# Patient Record
Sex: Female | Born: 1980 | Race: Black or African American | Hispanic: No | Marital: Single | Smoking: Never smoker
Health system: Southern US, Community
[De-identification: ages and names within clinical notes are randomized; demographics above are authoritative.]

---

## 2000-08-22 ENCOUNTER — Emergency Department (HOSPITAL_COMMUNITY): Admission: EM | Admit: 2000-08-22 | Discharge: 2000-08-22 | Payer: Self-pay | Admitting: Emergency Medicine

## 2000-08-27 ENCOUNTER — Emergency Department (HOSPITAL_COMMUNITY): Admission: EM | Admit: 2000-08-27 | Discharge: 2000-08-27 | Payer: Self-pay | Admitting: Emergency Medicine

## 2000-09-09 ENCOUNTER — Other Ambulatory Visit: Admission: RE | Admit: 2000-09-09 | Discharge: 2000-09-09 | Payer: Self-pay | Admitting: Obstetrics

## 2001-02-16 ENCOUNTER — Inpatient Hospital Stay (HOSPITAL_COMMUNITY): Admission: AD | Admit: 2001-02-16 | Discharge: 2001-02-16 | Payer: Self-pay | Admitting: Obstetrics

## 2001-03-04 ENCOUNTER — Inpatient Hospital Stay (HOSPITAL_COMMUNITY): Admission: AD | Admit: 2001-03-04 | Discharge: 2001-03-07 | Payer: Self-pay | Admitting: Obstetrics

## 2001-03-09 ENCOUNTER — Inpatient Hospital Stay (HOSPITAL_COMMUNITY): Admission: AD | Admit: 2001-03-09 | Discharge: 2001-03-09 | Payer: Self-pay | Admitting: Obstetrics

## 2002-09-03 ENCOUNTER — Emergency Department (HOSPITAL_COMMUNITY): Admission: EM | Admit: 2002-09-03 | Discharge: 2002-09-03 | Payer: Self-pay | Admitting: Emergency Medicine

## 2003-02-22 ENCOUNTER — Emergency Department (HOSPITAL_COMMUNITY): Admission: EM | Admit: 2003-02-22 | Discharge: 2003-02-22 | Payer: Self-pay | Admitting: Emergency Medicine

## 2004-11-02 ENCOUNTER — Inpatient Hospital Stay (HOSPITAL_COMMUNITY): Admission: AD | Admit: 2004-11-02 | Discharge: 2004-11-02 | Payer: Self-pay | Admitting: Obstetrics and Gynecology

## 2004-11-18 ENCOUNTER — Inpatient Hospital Stay (HOSPITAL_COMMUNITY): Admission: AD | Admit: 2004-11-18 | Discharge: 2004-11-18 | Payer: Self-pay | Admitting: *Deleted

## 2005-05-13 ENCOUNTER — Inpatient Hospital Stay (HOSPITAL_COMMUNITY): Admission: AD | Admit: 2005-05-13 | Discharge: 2005-05-13 | Payer: Self-pay | Admitting: Obstetrics

## 2005-06-06 ENCOUNTER — Inpatient Hospital Stay (HOSPITAL_COMMUNITY): Admission: AD | Admit: 2005-06-06 | Discharge: 2005-06-06 | Payer: Self-pay | Admitting: Obstetrics

## 2005-06-16 ENCOUNTER — Inpatient Hospital Stay (HOSPITAL_COMMUNITY): Admission: AD | Admit: 2005-06-16 | Discharge: 2005-06-19 | Payer: Self-pay | Admitting: Obstetrics

## 2005-10-15 ENCOUNTER — Emergency Department (HOSPITAL_COMMUNITY): Admission: EM | Admit: 2005-10-15 | Discharge: 2005-10-15 | Payer: Self-pay | Admitting: Emergency Medicine

## 2006-07-21 ENCOUNTER — Ambulatory Visit (HOSPITAL_COMMUNITY): Admission: RE | Admit: 2006-07-21 | Discharge: 2006-07-21 | Payer: Self-pay | Admitting: Obstetrics and Gynecology

## 2006-08-24 ENCOUNTER — Inpatient Hospital Stay (HOSPITAL_COMMUNITY): Admission: AD | Admit: 2006-08-24 | Discharge: 2006-08-24 | Payer: Self-pay | Admitting: Obstetrics

## 2006-09-13 ENCOUNTER — Inpatient Hospital Stay (HOSPITAL_COMMUNITY): Admission: AD | Admit: 2006-09-13 | Discharge: 2006-09-13 | Payer: Self-pay | Admitting: Obstetrics

## 2006-09-16 ENCOUNTER — Inpatient Hospital Stay (HOSPITAL_COMMUNITY): Admission: AD | Admit: 2006-09-16 | Discharge: 2006-09-19 | Payer: Self-pay | Admitting: Obstetrics

## 2006-09-18 ENCOUNTER — Encounter (INDEPENDENT_AMBULATORY_CARE_PROVIDER_SITE_OTHER): Payer: Self-pay | Admitting: Specialist

## 2008-03-26 IMAGING — US US OB COMP +14 WK
1 series · 14 of 28 positions shown · non-contrast
Comparison: none

OBSTETRICAL ULTRASOUND:
 This ultrasound was performed in The [HOSPITAL], and the [facility] report will be stored to [REDACTED] PACS.

[Series 1: us ob comp +14 wk · 14 of 70 slices shown]
[im 3/70]
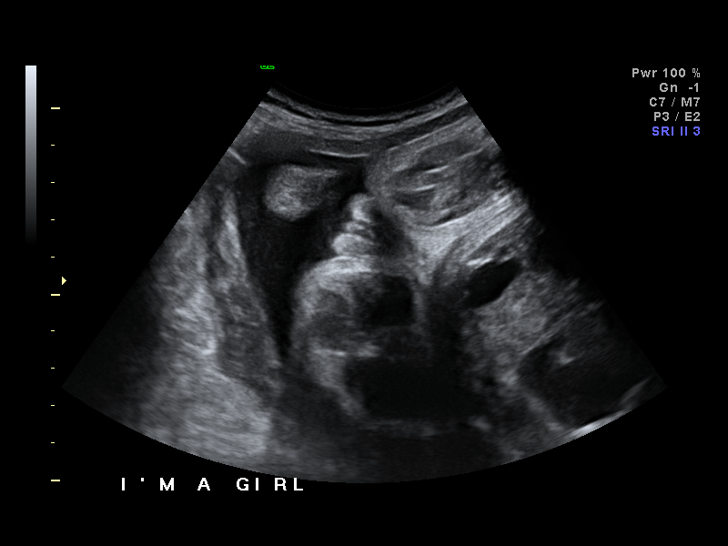
[im 8/70]
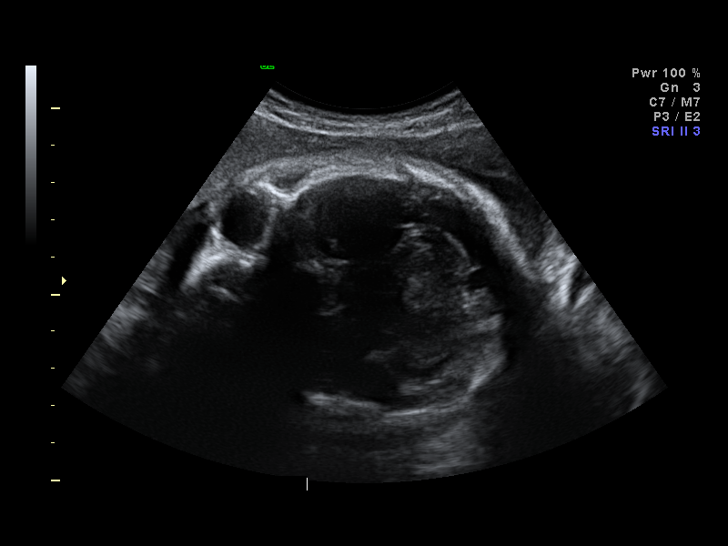
[im 13/70]
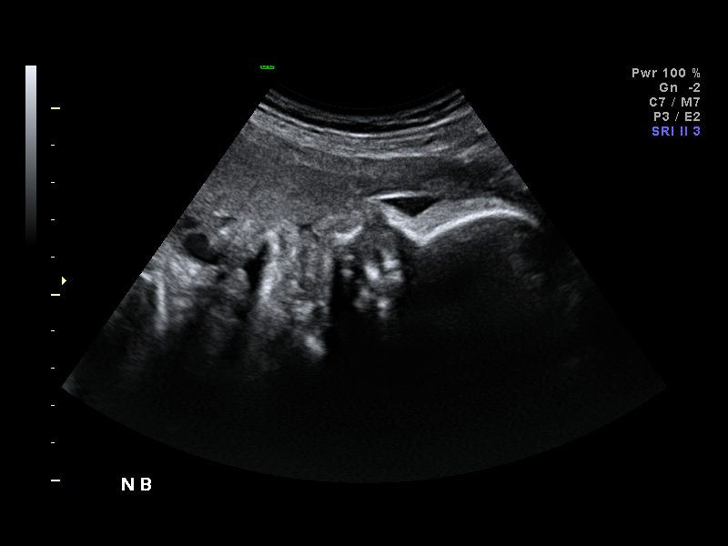
[im 18/70]
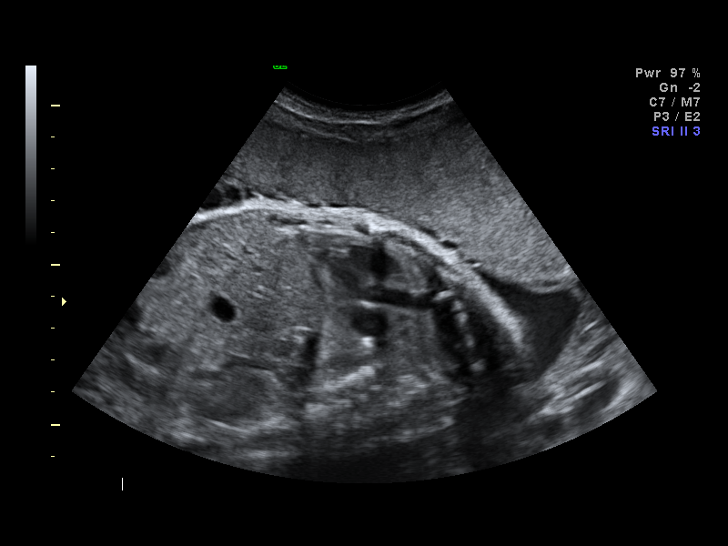
[im 24/70]
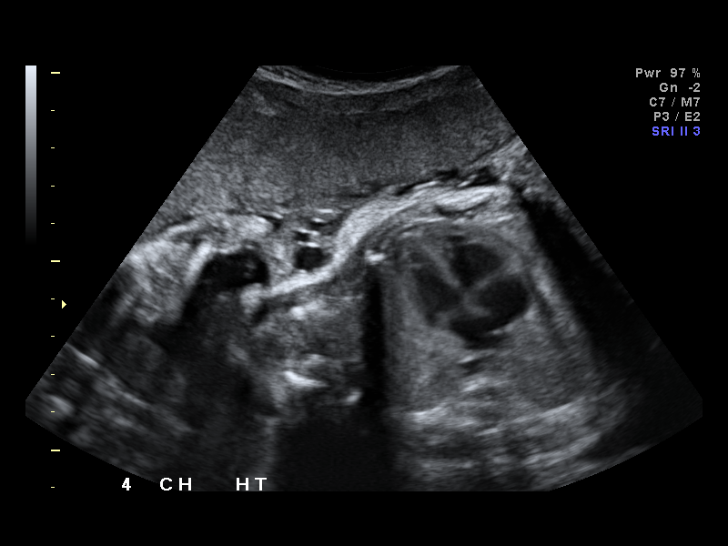
[im 29/70]
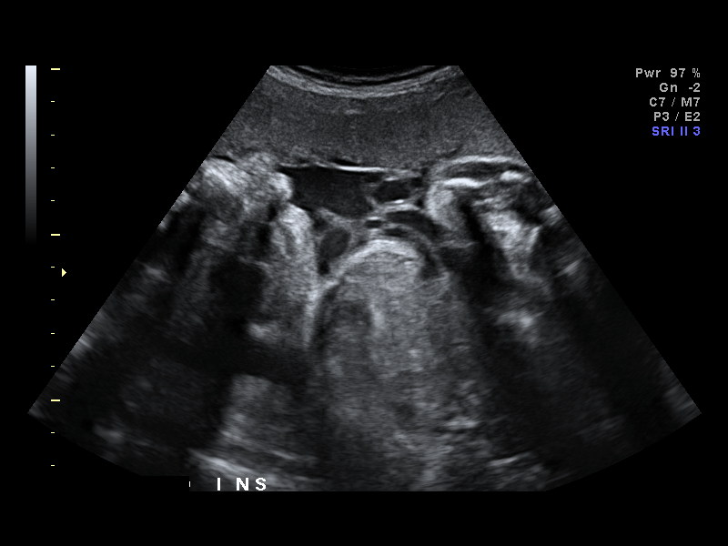
[im 34/70]
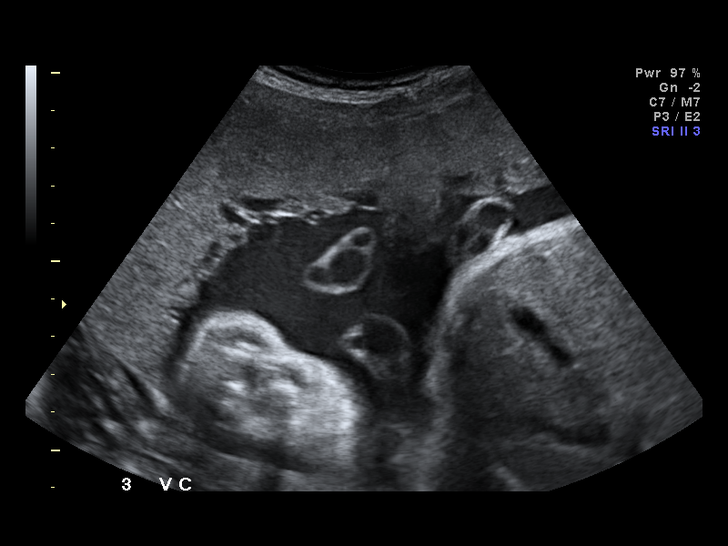
[im 39/70]
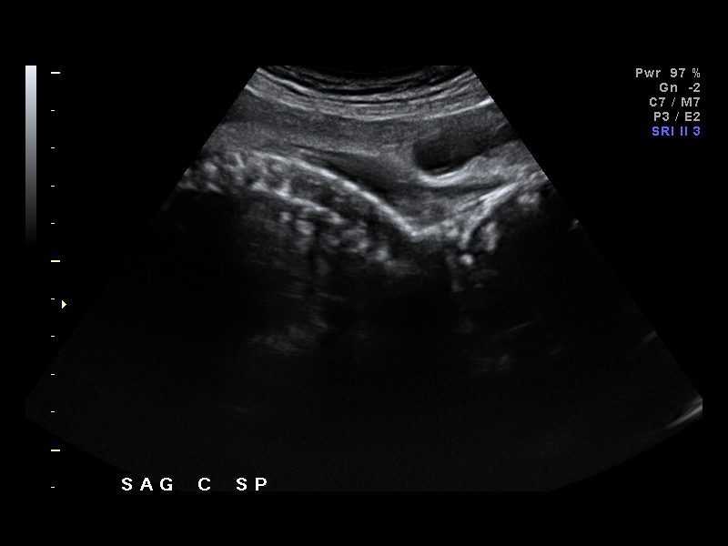
[im 44/70]
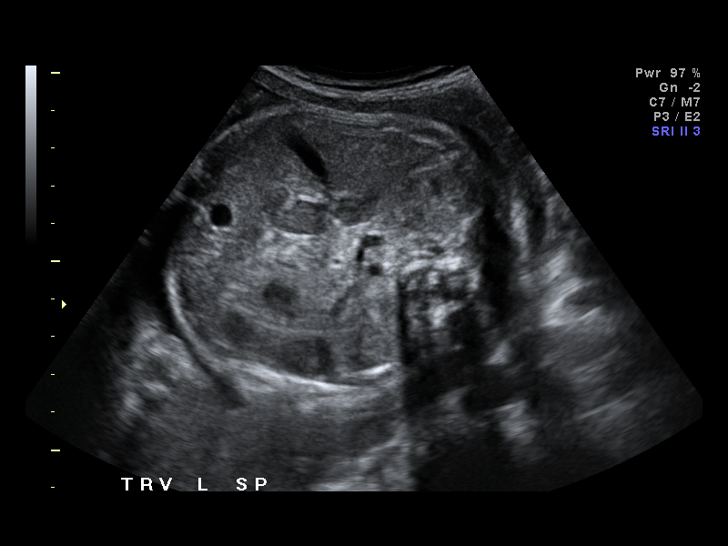
[im 49/70]
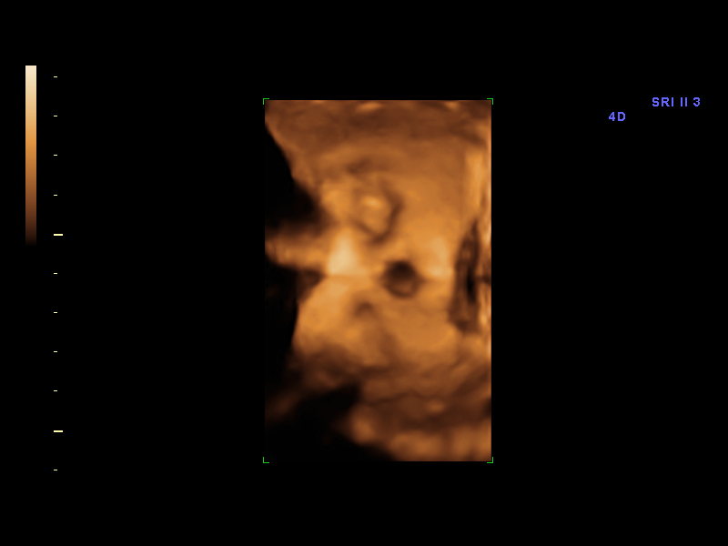
[im 54/70]
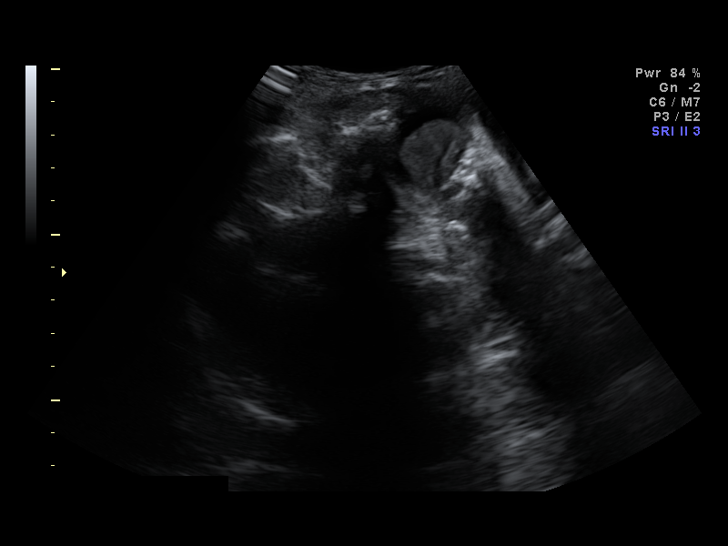
[im 59/70]
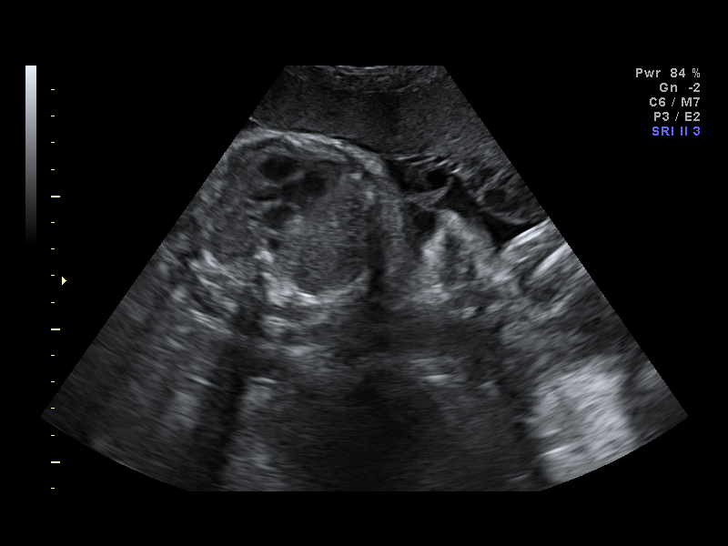
[im 64/70]
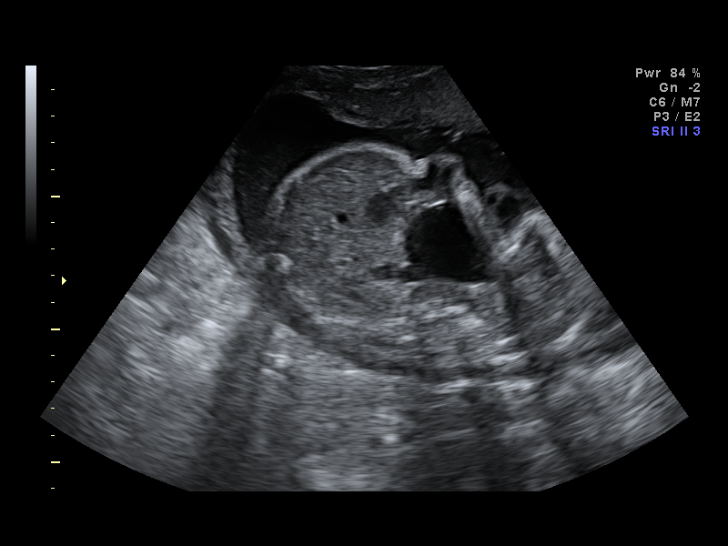
[im 70/70]
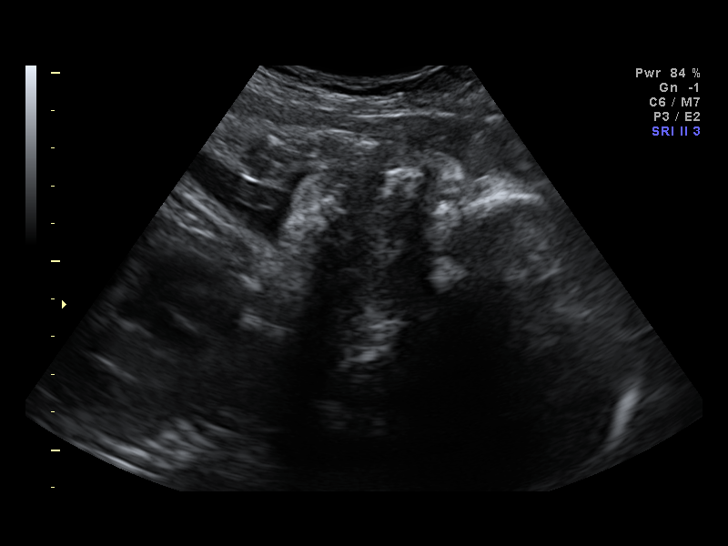

[14 of 28 positions shown; findings below may reference images not displayed]

IMPRESSION: [facility] has also been faxed to the ordering physician.

## 2009-07-21 ENCOUNTER — Emergency Department (HOSPITAL_COMMUNITY): Admission: EM | Admit: 2009-07-21 | Discharge: 2009-07-21 | Payer: Self-pay | Admitting: Family Medicine

## 2011-02-13 NOTE — Discharge Summary (Signed)
NAMEMADYSIN, CRISP NO.:  [id number]   MEDICAL RECORD NO.:  [id number]          PATIENT TYPE:  INP   LOCATION:  9128                          FACILITY:  [facility]   PHYSICIAN:  Kathreen Cosier, M.D.DATE OF BIRTH:  [birth year]   DATE OF ADMISSION:  09/16/2006  DATE OF DISCHARGE:  09/19/2006                               DISCHARGE SUMMARY   The patient is a 30 year old gravida 3, para 1-1-0-2, [facility] September 17, 2006, who was admitted in labor and had a normal vaginal delivery.  The  patient desired sterilization.  On the first postpartum day she  underwent a postpartum tubal ligation.  She did well.  Her hemoglobin  was 11.5, white count 15.1, platelets 160.  She was discharged home on  the second postpartum day ambulatory, was asked to see me in 6 weeks.   DISCHARGE DIAGNOSIS:  Status post normal vaginal delivery at term and  postpartum tubal ligation.           ______________________________  Kathreen Cosier, M.D.     BAM/MEDQ  D:  10/06/2006  T:  10/06/2006  Job:  [id number]

## 2011-02-13 NOTE — Op Note (Signed)
NAMELITISHA, Krista Holder NO.:  [id number]   MEDICAL RECORD NO.:  [id number]          PATIENT TYPE:  INP   LOCATION:  9128                          FACILITY:  [facility]   PHYSICIAN:  Kathreen Cosier, M.D.DATE OF BIRTH:  26-Dec-1980   DATE OF PROCEDURE:  09/18/2006  DATE OF DISCHARGE:                               OPERATIVE REPORT   PREOPERATIVE DIAGNOSIS:  Multiparity.   PROCEDURE:  Postpartum tubal ligation.   Using epidural with the patient in the supine position, abdomen prepped  and draped.  Bladder emptied with a straight catheter.  A midline  subumbilical incision made, carried out in a rectus fascia.  The fascia  grasped with Kochers and then the peritoneum opened.  The left tube  grasped in the mid portion with a Babcock clamp and traced to the  fimbria.  A 0 plane suture placed in the mid esophagus below the portion  of the tube within the clamp.  This was tied with approximately 1 inch  of tube transected.  Hemostasis satisfactory.  Procedure done in a  similar fashion on the other side.  The abdomen closed in layers.  The  peritoneum and fascia _________.  __________ closed with a subcuticular  stitch of 4-0 Monocryl.  Blood loss minimal.  Patient tolerated the  procedure well, taken to the recovery room in good condition.           ______________________________  Kathreen Cosier, M.D.     BAM/MEDQ  D:  09/18/2006  T:  09/18/2006  Job:  [id number]

## 2016-10-12 ENCOUNTER — Emergency Department (HOSPITAL_COMMUNITY)
Admission: EM | Admit: 2016-10-12 | Discharge: 2016-10-12 | Disposition: A | Discharge status: Home / Self Care | Payer: Medicaid Other | Attending: Emergency Medicine | Admitting: Emergency Medicine

## 2016-10-12 ENCOUNTER — Encounter (HOSPITAL_COMMUNITY): Payer: Self-pay | Admitting: Emergency Medicine

## 2016-10-12 DIAGNOSIS — J029 Acute pharyngitis, unspecified: Secondary | ICD-10-CM | POA: Insufficient documentation

## 2016-10-12 LAB — RAPID STREP SCREEN (MED CTR MEBANE ONLY): Streptococcus, Group A Screen (Direct): NEGATIVE

## 2016-10-12 MED ORDER — IBUPROFEN 800 MG PO TABS: 800.0000 mg | ORAL_TABLET | Freq: Three times a day (TID) | ORAL | 0 refills | Status: AC

## 2016-10-12 MED ORDER — PREDNISONE 10 MG (21) PO TBPK: ORAL_TABLET | ORAL | 0 refills | Status: AC

## 2016-10-12 NOTE — ED Provider Notes (Signed)
[facility]-EMERGENCY DEPT Provider Note   CSN: [id number] Arrival date & time: 10/12/16  [id number]     History   Chief Complaint Chief Complaint  Patient presents with  . Sore Throat    HPI Krista Holder is a 36 y.o. female.  HPI   Krista Holder is a 36 y.o. female, Patient with no pertinent past medical history, presenting to the ED with bilateral sore throat, bilateral ear pain, nasal and sinus congestion beginning yesterday. Patient has taken one dose of Mucinex without complete relief of symptoms.  Denies difficulty breathing, chest pain, fever/chills, nausea/vomiting, ear drainage, or any other complaints.  History reviewed. No pertinent past medical history.  There are no active problems to display for this patient.   History reviewed. No pertinent surgical history.  OB History    No data available       Home Medications    Prior to Admission medications   Medication Sig Start Date End Date Taking? Authorizing Provider  ibuprofen (ADVIL,MOTRIN) 800 MG tablet Take 1 tablet (800 mg total) by mouth 3 (three) times daily. 10/12/16   Judson Tsan C Kerston Landeck, PA-C  predniSONE (STERAPRED UNI-PAK 21 TAB) 10 MG (21) TBPK tablet Take 6 tabs day 1, 5 tabs day 2, 4 tabs day 3, 3 tabs day 4, 2 tabs day 5, and 1 tab on day 6. 10/12/16   Anselm PancoastShawn C Hena Ewalt, PA-C    Family History History reviewed. No pertinent family history.  Social History Social History  Substance Use Topics  . Smoking status: Never Smoker  . Smokeless tobacco: Never Used  . Alcohol use Yes     Comment: weekends     Allergies   Patient has no known allergies.   Review of Systems Review of Systems  Constitutional: Negative for fever.  HENT: Positive for congestion, ear pain, sinus pressure and sore throat. Negative for ear discharge and sinus pain.   Respiratory: Negative for shortness of breath.   Gastrointestinal: Negative for nausea and vomiting.     Physical Exam Updated Vital Signs BP 113/81 (BP Location:  Left Arm)   Pulse 75   Temp 98.4 F (36.9 C) (Oral)   Resp 18   Ht 5\' 2"  (1.575 m)   Wt 49.9 kg   LMP 10/04/2016 (Approximate)   SpO2 100%   BMI 20.12 kg/m   Physical Exam  Constitutional: She appears well-developed and well-nourished. No distress.  HENT:  Head: Normocephalic and atraumatic.  Right Ear: Tympanic membrane, external ear and ear canal normal.  Left Ear: Tympanic membrane, external ear and ear canal normal.  Nose: Rhinorrhea present.  Mouth/Throat: Posterior oropharyngeal erythema present.  Eyes: Conjunctivae are normal.  Neck: Neck supple.  Cardiovascular: Normal rate, regular rhythm, normal heart sounds and intact distal pulses.   Pulmonary/Chest: Effort normal and breath sounds normal. No respiratory distress.  Abdominal: Soft. There is no tenderness. There is no guarding.  Musculoskeletal: She exhibits no edema.  Lymphadenopathy:    She has no cervical adenopathy.  Neurological: She is alert.  Skin: Skin is warm and dry. She is not diaphoretic.  Psychiatric: She has a normal mood and affect. Her behavior is normal.  Nursing note and vitals reviewed.    ED Treatments / Results  Labs (all labs ordered are listed, but only abnormal results are displayed) Labs Reviewed  RAPID STREP SCREEN (NOT AT [facility])  CULTURE, GROUP A STREP [facility])    EKG  EKG Interpretation None       Radiology No  results found.  Procedures Procedures (including critical care time)  Medications Ordered in ED Medications - No data to display   Initial Impression / Assessment and Plan / ED Course  I have reviewed the triage vital signs and the nursing notes.  Pertinent labs & imaging results that were available during my care of the patient were reviewed by me and considered in my medical decision making (see chart for details).  Clinical Course     Patient presents with symptoms of URI, most likely to be viral at this point. Strep test negative. Symptomatic care and  return precautions discussed.     Final Clinical Impressions(s) / ED Diagnoses   Final diagnoses:  Pharyngitis, unspecified etiology    New Prescriptions Discharge Medication List as of 10/12/2016 10:13 AM    START taking these medications   Details  ibuprofen (ADVIL,MOTRIN) 800 MG tablet Take 1 tablet (800 mg total) by mouth 3 (three) times daily., Starting Mon 10/12/2016, Print    predniSONE (STERAPRED UNI-PAK 21 TAB) 10 MG (21) TBPK tablet Take 6 tabs day 1, 5 tabs day 2, 4 tabs day 3, 3 tabs day 4, 2 tabs day 5, and 1 tab on day 6., Print         Anselm Pancoast, PA-C 10/13/16 0724    Jerelyn Scott, MD 10/13/16 [phone]

## 2016-10-12 NOTE — ED Triage Notes (Signed)
Pt from home with c/o sore throat, bilateral ear ache, and trouble swallowing starting yesterday.   Pt denies being around anyone else who's been sick.  NAD, tearful, A&O.

## 2016-10-12 NOTE — ED Notes (Signed)
Papers reviewed with patient and she verbalizes understanding. Patient left with out signing and upset that throat still hurt. Patient stormed out with papers before signing

## 2016-10-12 NOTE — Discharge Instructions (Signed)
The strep test was negative today. Your symptoms are consistent with a viral illness. Viruses do not require antibiotics. Treatment is symptomatic care and it is important to note that these symptoms may last for 7-10 days. Drink plenty of fluids and get plenty of rest. You should be drinking at least half a liter of water an hour to stay hydrated. Electrolyte drinks are also encouraged. Ibuprofen, Naproxen, or Tylenol for pain or fever. Plain Mucinex or Sudafed may help relieve congestion. Warm liquids or Chloraseptic spray may help soothe a sore throat. Follow up with a primary care provider, as needed, for any future management of this issue. Sinus rinses may also help decrease congestion.

## 2016-10-14 LAB — CULTURE, GROUP A STREP (THRC)

## 2017-05-24 ENCOUNTER — Ambulatory Visit: Payer: Medicaid Other | Admitting: Obstetrics

## 2017-06-14 ENCOUNTER — Ambulatory Visit: Payer: Medicaid Other | Admitting: Obstetrics

## 2017-07-07 ENCOUNTER — Ambulatory Visit: Payer: Medicaid Other | Admitting: Obstetrics

## 2017-10-06 ENCOUNTER — Ambulatory Visit: Payer: Medicaid Other | Admitting: Obstetrics

## 2017-10-06 ENCOUNTER — Encounter: Payer: Self-pay | Admitting: Obstetrics

## 2017-10-06 DIAGNOSIS — Z01419 Encounter for gynecological examination (general) (routine) without abnormal findings: Secondary | ICD-10-CM

## 2017-10-06 NOTE — Progress Notes (Signed)
Patient changed her mind about being seen, because is on her period. She prefers to wait until it is done and she will call us to reschedule.

## 2018-05-23 ENCOUNTER — Emergency Department (HOSPITAL_COMMUNITY)
Admission: EM | Admit: 2018-05-23 | Discharge: 2018-05-23 | Disposition: A | Discharge status: Home / Self Care | Payer: Medicaid Other | Attending: Emergency Medicine | Admitting: Emergency Medicine

## 2018-05-23 ENCOUNTER — Other Ambulatory Visit: Payer: Self-pay

## 2018-05-23 ENCOUNTER — Encounter (HOSPITAL_COMMUNITY): Payer: Self-pay

## 2018-05-23 DIAGNOSIS — Z79899 Other long term (current) drug therapy: Secondary | ICD-10-CM | POA: Insufficient documentation

## 2018-05-23 DIAGNOSIS — J029 Acute pharyngitis, unspecified: Secondary | ICD-10-CM | POA: Diagnosis not present

## 2018-05-23 DIAGNOSIS — R591 Generalized enlarged lymph nodes: Secondary | ICD-10-CM

## 2018-05-23 DIAGNOSIS — R0981 Nasal congestion: Secondary | ICD-10-CM | POA: Diagnosis not present

## 2018-05-23 DIAGNOSIS — R590 Localized enlarged lymph nodes: Secondary | ICD-10-CM | POA: Diagnosis not present

## 2018-05-23 LAB — CBC WITH DIFFERENTIAL/PLATELET
Abs Immature Granulocytes: 0 10*3/uL (ref 0.0–0.1)
Basophils Absolute: 0 10*3/uL (ref 0.0–0.1)
Basophils Relative: 1 %
Eosinophils Absolute: 0.1 10*3/uL (ref 0.0–0.7)
Eosinophils Relative: 2 %
HCT: 40.7 % (ref 36.0–46.0)
Hemoglobin: 13.3 g/dL (ref 12.0–15.0)
Immature Granulocytes: 0 %
Lymphocytes Relative: 30 %
Lymphs Abs: 1.1 10*3/uL (ref 0.7–4.0)
MCH: 31.7 pg (ref 26.0–34.0)
MCHC: 32.7 g/dL (ref 30.0–36.0)
MCV: 97.1 fL (ref 78.0–100.0)
Monocytes Absolute: 0.5 10*3/uL (ref 0.1–1.0)
Monocytes Relative: 14 %
Neutro Abs: 2 10*3/uL (ref 1.7–7.7)
Neutrophils Relative %: 53 %
Platelets: 190 10*3/uL (ref 150–400)
RBC: 4.19 MIL/uL (ref 3.87–5.11)
RDW: 12.1 % (ref 11.5–15.5)
WBC: 3.7 10*3/uL — ABNORMAL LOW (ref 4.0–10.5)

## 2018-05-23 LAB — COMPREHENSIVE METABOLIC PANEL
ALT: 19 U/L (ref 0–44)
AST: 26 U/L (ref 15–41)
Albumin: 4.1 g/dL (ref 3.5–5.0)
Alkaline Phosphatase: 57 U/L (ref 38–126)
Anion gap: 7 (ref 5–15)
BUN: 6 mg/dL (ref 6–20)
CO2: 30 mmol/L (ref 22–32)
Calcium: 9.4 mg/dL (ref 8.9–10.3)
Chloride: 104 mmol/L (ref 98–111)
Creatinine, Ser: 0.74 mg/dL (ref 0.44–1.00)
GFR calc Af Amer: >60 mL/min (ref >60)
GFR calc non Af Amer: >60 mL/min (ref >60)
Glucose, Bld: 92 mg/dL (ref 70–99)
Potassium: 3.7 mmol/L (ref 3.5–5.1)
Sodium: 141 mmol/L (ref 135–145)
Total Bilirubin: 0.5 mg/dL (ref 0.3–1.2)
Total Protein: 7 g/dL (ref 6.5–8.1)

## 2018-05-23 LAB — GROUP A STREP BY PCR: Group A Strep by PCR: NOT DETECTED

## 2018-05-23 MED ORDER — FLUTICASONE PROPIONATE 50 MCG/ACT NA SUSP: 1.0000 | Freq: Every day | NASAL | 0 refills | Status: AC

## 2018-05-23 MED ORDER — HYDROCORTISONE 1 % EX CREA: TOPICAL_CREAM | CUTANEOUS | 0 refills | Status: AC

## 2018-05-23 MED ORDER — DIPHENHYDRAMINE HCL 25 MG PO TABS: 25.0000 mg | ORAL_TABLET | Freq: Four times a day (QID) | ORAL | 0 refills | Status: AC

## 2018-05-23 NOTE — ED Provider Notes (Signed)
[facility] EMERGENCY DEPARTMENT Provider Note   CSN: [id number] Arrival date & time: 05/23/18  1020     History   Chief Complaint Chief Complaint  Patient presents with  . Lymphadenopathy    HPI Krista Holder is a 37 y.o. female who is previously healthy who presents with a one-week history of sore throat and a few day history of lymphadenopathy in her neck and under her chin. She also reports an area behind her ear. She reports she has also noticed nasal congestion and hx of sinus infections. She also reports an itchy spot on her chest that is raised and red. [place] an insect. She denies fever. She has not tried anything at home for her symptoms. She denies any chest pain, shortness of breath, cough, nausea, vomiting, abdominal pain, or ear pain.  HPI  History reviewed. No pertinent past medical history.  There are no active problems to display for this patient.   History reviewed. No pertinent surgical history.   OB History   None      Home Medications    Prior to Admission medications   Medication Sig Start Date End Date Taking? Authorizing Provider  Fexofenadine HCl (ALLERGY 24-HR PO) Take 1 tablet by mouth as needed (allergies).   Yes [provider]  ibuprofen (ADVIL,MOTRIN) 200 MG tablet Take 200 mg by mouth every 6 (six) hours as needed for cramping.   Yes [provider]  diphenhydrAMINE (BENADRYL) 25 MG tablet Take 1 tablet (25 mg total) by mouth every 6 (six) hours. 05/23/18   Avyukth Bontempo, Waylan Boga, PA-C  fluticasone (FLONASE) 50 MCG/ACT nasal spray Place 1 spray into both nostrils daily. 05/23/18   Emi Holes, PA-C  hydrocortisone cream 1 % Apply to affected area 2 times daily 05/23/18   Schylar Wuebker, Waylan Boga, PA-C  ibuprofen (ADVIL,MOTRIN) 800 MG tablet Take 1 tablet (800 mg total) by mouth 3 (three) times daily. Patient not taking: Reported on 05/23/2018 10/12/16   Joy,  Hillard Danker, PA-C  predniSONE (STERAPRED UNI-PAK 21 TAB) 10 MG (21) TBPK tablet Take 6 tabs day 1, 5 tabs day 2, 4 tabs day 3, 3 tabs day 4, 2 tabs day 5, and 1 tab on day 6. Patient not taking: Reported on 05/23/2018 10/12/16   Anselm Pancoast, PA-C    Family History History reviewed. No pertinent family history.  Social History Social History   Tobacco Use  . Smoking status: Never Smoker  . Smokeless tobacco: Never Used  Substance Use Topics  . Alcohol use: Yes    Comment: weekends  . Drug use: No     Allergies   Patient has no known allergies.   Review of Systems Review of Systems  Constitutional: Negative for chills and fever.  HENT: Positive for congestion, sinus pain and sore throat. Negative for facial swelling.   Respiratory: Negative for shortness of breath.   Cardiovascular: Negative for chest pain.  Gastrointestinal: Negative for abdominal pain, nausea and vomiting.  Genitourinary: Negative for dysuria.  Musculoskeletal: Negative for back pain.  Skin: Negative for rash and wound.  Neurological: Negative for headaches.  Hematological: Positive for adenopathy.  Psychiatric/Behavioral: The patient is not nervous/anxious.      Physical Exam Updated Vital Signs BP 139/83 (BP Location: Right Arm)   Pulse 71   Temp 98.9 F (37.2 C) (Oral)   Resp 16   LMP 05/23/2018 (Within Days)  SpO2 100%   Physical Exam  Constitutional: She appears well-developed and well-nourished. No distress.  HENT:  Head: Normocephalic and atraumatic.  Right Ear: Tympanic membrane normal.  Left Ear: Tympanic membrane normal.  Mouth/Throat: Posterior oropharyngeal erythema (mild) present. No oropharyngeal exudate, posterior oropharyngeal edema or tonsillar abscesses. Tonsils are 1+ on the right. Tonsils are 1+ on the left. No tonsillar exudate.  Eyes: Pupils are equal, round, and reactive to light. Conjunctivae are normal. Right eye exhibits no discharge. Left eye exhibits no discharge. No  scleral icterus.  Neck: Normal range of motion. Neck supple. No thyromegaly present.  Several, mobile, tender, <1 cm lymph nodes noted in the cervical chain bilaterally and in the submandibular space; 1 noted in the L post auricular space  Cardiovascular: Normal rate, regular rhythm, normal heart sounds and intact distal pulses. Exam reveals no gallop and no friction rub.  No murmur heard. Pulmonary/Chest: Effort normal and breath sounds normal. No stridor. No respiratory distress. She has no wheezes. She has no rales.    Abdominal: Soft. Bowel sounds are normal. She exhibits no distension. There is no tenderness. There is no rebound and no guarding.  Musculoskeletal: She exhibits no edema.  Lymphadenopathy:    She has no cervical adenopathy.  Neurological: She is alert. Coordination normal.  Skin: Skin is warm and dry. No rash noted. She is not diaphoretic. No pallor.  Psychiatric: She has a normal mood and affect.  Nursing note and vitals reviewed.    ED Treatments / Results  Labs (all labs ordered are listed, but only abnormal results are displayed) Labs Reviewed  CBC WITH DIFFERENTIAL/PLATELET - Abnormal; Notable for the following components:      Result Value   WBC 3.7 (*)    All other components within normal limits  GROUP A STREP BY PCR  COMPREHENSIVE METABOLIC PANEL    EKG None  Radiology No results found.  Procedures Procedures (including critical care time)  Medications Ordered in ED Medications - No data to display   Initial Impression / Assessment and Plan / ED Course  I have reviewed the triage vital signs and the nursing notes.  Pertinent labs & imaging results that were available during my care of the patient were reviewed by me and considered in my medical decision making (see chart for details).     Patient with lymphadenopathy in the setting of sore throat and nasal congestion. She has stable labs and vitals. Strep PCR negative. Most likely  reactive and will treat supportively with Flonase, benadryl, and hydrocortisone for suspected insect bite to the chest and back. Patient advised to follow up with PCP for recheck, however. Return precautions discussed. She understands and agrees with plan. Patient vitals stable throughout ED course and discharged in satisfactory condition. I discussed patient case with Dr. Criss AlvineGoldston who guided the patient's management and agrees with plan.   Final Clinical Impressions(s) / ED Diagnoses   Final diagnoses:  Lymphadenopathy  Sore throat    ED Discharge Orders         Ordered    fluticasone (FLONASE) 50 MCG/ACT nasal spray  Daily     05/23/18 1335    diphenhydrAMINE (BENADRYL) 25 MG tablet  Every 6 hours     05/23/18 1335    hydrocortisone cream 1 %     05/23/18 1335           Annaleah Arata, [place] M, PA-C 05/23/18 1819    Pricilla LovelessGoldston, Scott, MD 05/24/18 1146

## 2018-05-23 NOTE — Discharge Instructions (Signed)
You can take ibuprofen and Tylenol as prescribed over-the-counter, as needed for your throat pain.  Use Flonase once daily to help with nasal congestion.  You can take Benadryl for itching, as prescribed.  You can apply hydrocortisone lotion to itching areas twice daily as needed.  Please follow-up with your doctor for recheck if by the end of the week.  Please return the emergency department if you develop any new or worsening symptoms.

## 2018-05-23 NOTE — ED Notes (Signed)
GOT PATIENT INTO A GOWN ON THE MONITOR PATIENT IS RESTING WITH CALL BELL

## 2018-05-23 NOTE — ED Triage Notes (Signed)
Pt states sore throat last week. Woke up Friday with some swelling around her glands in her neck. Pt reports feeling like she has itching in her throat. Minimal swelling noted. Airway intact.

## 2019-08-17 ENCOUNTER — Ambulatory Visit (HOSPITAL_COMMUNITY)
Admission: EM | Admit: 2019-08-17 | Discharge: 2019-08-17 | Disposition: A | Discharge status: Home / Self Care | Payer: Medicaid Other | Attending: Emergency Medicine | Admitting: Emergency Medicine

## 2019-08-17 ENCOUNTER — Other Ambulatory Visit: Payer: Self-pay

## 2019-08-17 ENCOUNTER — Encounter (HOSPITAL_COMMUNITY): Payer: Self-pay | Admitting: Emergency Medicine

## 2019-08-17 DIAGNOSIS — B9689 Other specified bacterial agents as the cause of diseases classified elsewhere: Secondary | ICD-10-CM

## 2019-08-17 DIAGNOSIS — N760 Acute vaginitis: Secondary | ICD-10-CM | POA: Diagnosis not present

## 2019-08-17 DIAGNOSIS — B373 Candidiasis of vulva and vagina: Secondary | ICD-10-CM | POA: Diagnosis present

## 2019-08-17 DIAGNOSIS — B3731 Acute candidiasis of vulva and vagina: Secondary | ICD-10-CM

## 2019-08-17 LAB — POCT URINALYSIS DIP (DEVICE)
Bilirubin Urine: NEGATIVE
Glucose, UA: NEGATIVE mg/dL
Hgb urine dipstick: NEGATIVE
Ketones, ur: NEGATIVE mg/dL
Leukocytes,Ua: NEGATIVE
Nitrite: NEGATIVE
Protein, ur: NEGATIVE mg/dL
Specific Gravity, Urine: 1.02 (ref 1.005–1.030)
Urobilinogen, UA: 0.2 mg/dL (ref 0.0–1.0)
pH: 7 (ref 5.0–8.0)

## 2019-08-17 MED ORDER — METRONIDAZOLE 500 MG PO TABS: 500.0000 mg | ORAL_TABLET | Freq: Two times a day (BID) | ORAL | 0 refills | Status: AC

## 2019-08-17 MED ORDER — FLUCONAZOLE 200 MG PO TABS: 200.0000 mg | ORAL_TABLET | Freq: Every day | ORAL | 0 refills | Status: AC

## 2019-08-17 NOTE — Discharge Instructions (Addendum)
Drink plenty of fluids  Take full dose of medications till complete Take a probiotic over the counter daily to help  Take motrin 600mg  every 6 hours as needed for pain  We will send off your labs and if anything returns we will call you with results.

## 2019-08-17 NOTE — ED Triage Notes (Signed)
PT reports vaginal discomfort started 1 week ago. Frequent urination, lower abdominal pain. No discharge. Has tried an AZO product and vagisil without relief. Itching in vaginal area.

## 2019-08-17 NOTE — ED Provider Notes (Signed)
[facility]    CSN: [id number] Arrival date & time: 08/17/19  1601      History   Chief Complaint Chief Complaint  Patient presents with  . Vaginal Itching    HPI Krista Holder is a 38 y.o. female.   Has vaginal itching and buring with intermit white discharge. States that it has been like this for a few days. Take been taking otc azo and creams with no relief. No vaginal bleeding. LMP last month normal.      History reviewed. No pertinent past medical history.  There are no active problems to display for this patient.   History reviewed. No pertinent surgical history.  OB History   No obstetric history on file.      Home Medications    Prior to Admission medications   Medication Sig Start Date End Date Taking? Authorizing Provider  ibuprofen (ADVIL,MOTRIN) 800 MG tablet Take 1 tablet (800 mg total) by mouth 3 (three) times daily. 10/12/16  Yes Joy, Shawn C, PA-C  diphenhydrAMINE (BENADRYL) 25 MG tablet Take 1 tablet (25 mg total) by mouth every 6 (six) hours. 05/23/18   Law, Bea Graff, PA-C  Fexofenadine HCl (ALLERGY 24-HR PO) Take 1 tablet by mouth as needed (allergies).    [provider]  fluconazole (DIFLUCAN) 200 MG tablet Take 1 tablet (200 mg total) by mouth daily for 7 days. 08/17/19 08/24/19  Marney Setting, NP  fluticasone (FLONASE) 50 MCG/ACT nasal spray Place 1 spray into both nostrils daily. 05/23/18   Frederica Kuster, PA-C  hydrocortisone cream 1 % Apply to affected area 2 times daily 05/23/18   Law, Bea Graff, PA-C  ibuprofen (ADVIL,MOTRIN) 200 MG tablet Take 200 mg by mouth every 6 (six) hours as needed for cramping.    [provider]  metroNIDAZOLE (FLAGYL) 500 MG tablet Take 1 tablet (500 mg total) by mouth 2 (two) times daily. 08/17/19   Marney Setting, NP  predniSONE (STERAPRED UNI-PAK 21 TAB) 10 MG (21) TBPK tablet Take 6 tabs day 1, 5 tabs day 2, 4 tabs day 3, 3 tabs day 4, 2 tabs day 5, and 1 tab on  day 6. Patient not taking: Reported on 05/23/2018 10/12/16   Lorayne Bender, PA-C    Family History No family history on file.  Social History Social History   Tobacco Use  . Smoking status: Never Smoker  . Smokeless tobacco: Never Used  Substance Use Topics  . Alcohol use: Yes    Comment: weekends  . Drug use: No     Allergies   Patient has no known allergies.   Review of Systems Review of Systems  Constitutional: Negative.   Respiratory: Negative.   Cardiovascular: Negative.   Gastrointestinal: Negative.   Genitourinary: Positive for vaginal discharge.       Redness and burning pain to internal and external vaginal area.   Skin:       Erythema to external vaginal      Physical Exam Triage Vital Signs ED Triage Vitals  Enc Vitals Group     BP 08/17/19 1619 120/79     Pulse Rate 08/17/19 1619 76     Resp 08/17/19 1619 16     Temp 08/17/19 1619 99 F (37.2 C)     Temp Source 08/17/19 1619 Oral     SpO2 08/17/19 1619 100 %     Weight --      Height --      Head  Circumference --      Peak Flow --      Pain Score 08/17/19 1617 4     Pain Loc --      Pain Edu? --      Excl. in GC? --    No data found.  Updated Vital Signs BP 120/79   Pulse 76   Temp 99 F (37.2 C) (Oral)   Resp 16   LMP 07/18/2019   SpO2 100%   Visual Acuity Right Eye Distance:   Left Eye Distance:   Bilateral Distance:    Right Eye Near:   Left Eye Near:    Bilateral Near:     Physical Exam Cardiovascular:     Rate and Rhythm: Normal rate.  Pulmonary:     Effort: Pulmonary effort is normal.  Abdominal:     Tenderness: There is abdominal tenderness.  Genitourinary:    Vagina: Vaginal discharge present.     Comments: Erythema to labia minora Skin:    General: Skin is warm.  Neurological:     General: No focal deficit present.     Mental Status: She is alert.      UC Treatments / Results  Labs (all labs ordered are listed, but only abnormal results are displayed)  Labs Reviewed  URINE CULTURE  POCT URINALYSIS DIP (DEVICE)    EKG   Radiology No results found.  Procedures Procedures (including critical care time)  Medications Ordered in UC Medications - No data to display  Initial Impression / Assessment and Plan / UC Course  I have reviewed the triage vital signs and the nursing notes.  Pertinent labs & imaging results that were available during my care of the patient were reviewed by me and considered in my medical decision making (see chart for details).    Drink plenty of fluids  Take full dose of medications till complete Take a probiotic over the counter daily to help  Take motrin 600mg  every 6 hours as needed for pain  We will send off your labs and if anything returns we will call you with results.   Final Clinical Impressions(s) / UC Diagnoses   Final diagnoses:  Bacterial vaginitis  Vaginal yeast infection     Discharge Instructions     Drink plenty of fluids  Take full dose of medications till complete Take a probiotic over the counter daily to help  Take motrin 600mg  every 6 hours as needed for pain  We will send off your labs and if anything returns we will call you with results.     ED Prescriptions    Medication Sig Dispense Auth. Provider   metroNIDAZOLE (FLAGYL) 500 MG tablet Take 1 tablet (500 mg total) by mouth 2 (two) times daily. 14 tablet L, NP   fluconazole (DIFLUCAN) 200 MG tablet Take 1 tablet (200 mg total) by mouth daily for 7 days. 7 tablet , NP     PDMP not reviewed this encounter.   Maple Mirza, NP 08/17/19 1724

## 2019-08-18 LAB — URINE CULTURE: Culture: <10000 — AB

## 2020-03-28 DIAGNOSIS — Z419 Encounter for procedure for purposes other than remedying health state, unspecified: Secondary | ICD-10-CM | POA: Diagnosis not present

## 2020-04-03 ENCOUNTER — Other Ambulatory Visit: Payer: Self-pay

## 2020-04-03 ENCOUNTER — Ambulatory Visit (HOSPITAL_COMMUNITY)
Admission: EM | Admit: 2020-04-03 | Discharge: 2020-04-03 | Disposition: A | Discharge status: Home / Self Care | Payer: Medicaid Other | Attending: Emergency Medicine | Admitting: Emergency Medicine

## 2020-04-03 ENCOUNTER — Encounter (HOSPITAL_COMMUNITY): Payer: Self-pay | Admitting: Emergency Medicine

## 2020-04-03 DIAGNOSIS — N760 Acute vaginitis: Secondary | ICD-10-CM | POA: Insufficient documentation

## 2020-04-03 DIAGNOSIS — N898 Other specified noninflammatory disorders of vagina: Secondary | ICD-10-CM | POA: Diagnosis not present

## 2020-04-03 LAB — POCT URINALYSIS DIP (DEVICE)
Bilirubin Urine: NEGATIVE
Glucose, UA: 100 mg/dL — AB
Hgb urine dipstick: NEGATIVE
Ketones, ur: NEGATIVE mg/dL
Leukocytes,Ua: NEGATIVE
Nitrite: POSITIVE — AB
Protein, ur: NEGATIVE mg/dL
Specific Gravity, Urine: <=1.005 (ref 1.005–1.030)
Urobilinogen, UA: 1 mg/dL (ref 0.0–1.0)
pH: 5 (ref 5.0–8.0)

## 2020-04-03 MED ORDER — FLUCONAZOLE 150 MG PO TABS: ORAL_TABLET | ORAL | 0 refills | Status: DC

## 2020-04-03 MED ORDER — CLOTRIMAZOLE 1 % VA CREA: TOPICAL_CREAM | VAGINAL | 0 refills | Status: AC

## 2020-04-03 NOTE — Discharge Instructions (Signed)
I suspect your itching is related to yeast, so I have sent medication to the pharmacy. Take 1 pill today. May repeat in three days if you have not had complete relief.  We will notify of you any positive findings or if any changes to treatment are needed. If normal or otherwise without concern to your results, we will not call you. Please log on to your MyChart to review your results if interested in so.   Wear lose clothing. Change out of any clothing that gets wet/sweaty, promptly.  If symptoms worsen or do not improve in the next week to return to be seen or to follow up with your PCP.

## 2020-04-03 NOTE — ED Triage Notes (Signed)
Patient  Started having symptoms one week ago.  Patient thought this was yeast.  Patient bough monistat, thought to be getting better, but symptoms have worsened.  Burning with urination, itching, no vaginal discharge, no odor

## 2020-04-04 ENCOUNTER — Telehealth (HOSPITAL_COMMUNITY): Payer: Self-pay

## 2020-04-04 DIAGNOSIS — A599 Trichomoniasis, unspecified: Secondary | ICD-10-CM

## 2020-04-04 DIAGNOSIS — N760 Acute vaginitis: Secondary | ICD-10-CM

## 2020-04-04 LAB — CERVICOVAGINAL ANCILLARY ONLY
Bacterial Vaginitis (gardnerella): POSITIVE — AB
Candida Glabrata: NEGATIVE
Candida Vaginitis: POSITIVE — AB
Chlamydia: NEGATIVE
Comment: NEGATIVE
Comment: NEGATIVE
Comment: NEGATIVE
Comment: NEGATIVE
Comment: NEGATIVE
Comment: NORMAL
Neisseria Gonorrhea: NEGATIVE
Trichomonas: POSITIVE — AB

## 2020-04-04 MED ORDER — METRONIDAZOLE 500 MG PO TABS: 500.0000 mg | ORAL_TABLET | Freq: Two times a day (BID) | ORAL | 0 refills | Status: AC

## 2020-04-04 NOTE — ED Provider Notes (Signed)
[facility]    CSN: [id number] Arrival date & time: 04/03/20  1622      History   Chief Complaint Chief Complaint  Patient presents with  . Vaginal Discharge    HPI Krista Holder is a 39 y.o. female.   Krista Holder presents with complaints of significant vulvo-vaginal burning and itching. No obvious vaginal discharge or odor. Sexually active with her husband, no concern for std exposure. Denies any sores/ lesions. No vaginal bleeding. Denies  Any urinary symptoms. She does work outside in the heat, as well as has been in the pool swimming- endorsing increased moisture to the area. She tried topical monistat for a few days which seemed to just burn rather than help. Symptoms started 1 week ago. She has also taken oral AZO which hasn't helped.    ROS per HPI, negative if not otherwise mentioned.      History reviewed. No pertinent past medical history.  There are no problems to display for this patient.   History reviewed. No pertinent surgical history.  OB History   No obstetric history on file.      Home Medications    Prior to Admission medications   Medication Sig Start Date End Date Taking? Authorizing Provider  Miconazole Nitrate (MONISTAT-DERM EX) Apply topically.   Yes [provider]  Phenazopyridine HCl (AZO TABS PO) Take by mouth.   Yes [provider]  clotrimazole (GYNE-LOTRIMIN) 1 % vaginal cream May apply topically once daily as needed for itching 04/03/20   Linus Mako B, NP  diphenhydrAMINE (BENADRYL) 25 MG tablet Take 1 tablet (25 mg total) by mouth every 6 (six) hours. 05/23/18   Law, Waylan Boga, PA-C  Fexofenadine HCl (ALLERGY 24-HR PO) Take 1 tablet by mouth as needed (allergies).    [provider]  fluconazole (DIFLUCAN) 150 MG tablet Take 1 tablet today. If still with symptoms may repeat in 3 days. 04/03/20   Georgetta Haber, NP  fluticasone (FLONASE) 50 MCG/ACT nasal spray Place 1 spray into both  nostrils daily. 05/23/18   Emi Holes, PA-C  hydrocortisone cream 1 % Apply to affected area 2 times daily 05/23/18   Law, Waylan Boga, PA-C  ibuprofen (ADVIL,MOTRIN) 200 MG tablet Take 200 mg by mouth every 6 (six) hours as needed for cramping.    [provider]  ibuprofen (ADVIL,MOTRIN) 800 MG tablet Take 1 tablet (800 mg total) by mouth 3 (three) times daily. 10/12/16   Joy, Shawn C, PA-C  metroNIDAZOLE (FLAGYL) 500 MG tablet Take 1 tablet (500 mg total) by mouth 2 (two) times daily. 08/17/19   Coralyn Mark, NP  metroNIDAZOLE (FLAGYL) 500 MG tablet Take 1 tablet (500 mg total) by mouth 2 (two) times daily. 04/04/20   Lamptey, Britta Mccreedy, MD  predniSONE (STERAPRED UNI-PAK 21 TAB) 10 MG (21) TBPK tablet Take 6 tabs day 1, 5 tabs day 2, 4 tabs day 3, 3 tabs day 4, 2 tabs day 5, and 1 tab on day 6. Patient not taking: Reported on 05/23/2018 10/12/16   Anselm Pancoast, PA-C    Family History Family History  Problem Relation Age of Onset  . Hypertension Mother     Social History Social History   Tobacco Use  . Smoking status: Never Smoker  . Smokeless tobacco: Never Used  Substance Use Topics  . Alcohol use: Yes    Comment: weekends  . Drug use: No     Allergies   Patient has  no known allergies.   Review of Systems Review of Systems   Physical Exam Triage Vital Signs ED Triage Vitals  Enc Vitals Group     BP 04/03/20 1801 131/72     Pulse Rate 04/03/20 1801 77     Resp 04/03/20 1801 18     Temp 04/03/20 1801 98.3 F (36.8 C)     Temp Source 04/03/20 1801 Oral     SpO2 04/03/20 1801 100 %     Weight --      Height --      Head Circumference --      Peak Flow --      Pain Score 04/03/20 1756 8     Pain Loc --      Pain Edu? --      Excl. in GC? --    No data found.  Updated Vital Signs BP 131/72 (BP Location: Right Arm)   Pulse 77   Temp 98.3 F (36.8 C) (Oral)   Resp 18   LMP 03/17/2020   SpO2 100%   Visual Acuity Right Eye Distance:   Left  Eye Distance:   Bilateral Distance:    Right Eye Near:   Left Eye Near:    Bilateral Near:     Physical Exam Constitutional:      General: She is not in acute distress.    Appearance: She is well-developed.  Cardiovascular:     Rate and Rhythm: Normal rate.  Pulmonary:     Effort: Pulmonary effort is normal.  Genitourinary:    General: Normal vulva.     Comments: No visible discharge, sores, lesions or rash to vulva noted Skin:    General: Skin is warm and dry.  Neurological:     Mental Status: She is alert and oriented to person, place, and time.      UC Treatments / Results  Labs (all labs ordered are listed, but only abnormal results are displayed) Labs Reviewed  POCT URINALYSIS DIP (DEVICE) - Abnormal; Notable for the following components:      Result Value   Glucose, UA 100 (*)    Nitrite POSITIVE (*)    All other components within normal limits  CERVICOVAGINAL ANCILLARY ONLY - Abnormal; Notable for the following components:   Trichomonas Positive (*)    Bacterial Vaginitis (gardnerella) Positive (*)    Candida Vaginitis Positive (*)    All other components within normal limits    EKG   Radiology No results found.  Procedures Procedures (including critical care time)  Medications Ordered in UC Medications - No data to display  Initial Impression / Assessment and Plan / UC Course  I have reviewed the triage vital signs and the nursing notes.  Pertinent labs & imaging results that were available during my care of the patient were reviewed by me and considered in my medical decision making (see chart for details).    Exam is overall unremarkable without obvious source of itching, externally. Diflucan provided at this time based on subjective complaints of significant itching, with recent moisture exposures as well. Vaginal cytology pending. Return precautions provided. Patient verbalized understanding and agreeable to plan.   Final Clinical  Impressions(s) / UC Diagnoses   Final diagnoses:  Acute vaginitis     Discharge Instructions     I suspect your itching is related to yeast, so I have sent medication to the pharmacy. Take 1 pill today. May repeat in three days if you have not had complete relief.  We will notify of you any positive findings or if any changes to treatment are needed. If normal or otherwise without concern to your results, we will not call you. Please log on to your MyChart to review your results if interested in so.   Wear lose clothing. Change out of any clothing that gets wet/sweaty, promptly.  If symptoms worsen or do not improve in the next week to return to be seen or to follow up with your PCP.      ED Prescriptions    Medication Sig Dispense Auth. Provider   fluconazole (DIFLUCAN) 150 MG tablet Take 1 tablet today. If still with symptoms may repeat in 3 days. 2 tablet Linus Mako B, NP   clotrimazole (GYNE-LOTRIMIN) 1 % vaginal cream May apply topically once daily as needed for itching 45 g Linus Mako B, NP     PDMP not reviewed this encounter.   Georgetta Haber, NP 04/04/20 2045

## 2020-04-10 ENCOUNTER — Telehealth: Payer: Self-pay | Admitting: *Deleted

## 2020-04-10 DIAGNOSIS — N898 Other specified noninflammatory disorders of vagina: Secondary | ICD-10-CM

## 2020-04-10 NOTE — Telephone Encounter (Signed)
Referral to case management for EMMI red.

## 2020-04-28 DIAGNOSIS — Z419 Encounter for procedure for purposes other than remedying health state, unspecified: Secondary | ICD-10-CM | POA: Diagnosis not present

## 2020-05-01 ENCOUNTER — Other Ambulatory Visit: Payer: Self-pay | Admitting: *Deleted

## 2020-05-01 NOTE — Patient Outreach (Addendum)
Care Coordination - Case Manager  05/01/2020  Krista Holder 21-May-1981 [id number]  Subjective: Telephone call to patient's home / mobile number, spoke with patient, and HIPAA verified.  Discussed [facility] Care Management Medicaid referral follow up, patient voiced understanding, and is in agreement to a brief follow up.   States she is doing pretty good, currently at work, very busy, and is not interested in the services or joining a program.  Patient states she does not have any Secretary/administrator, transition of care, care coordination, disease management, disease monitoring, transportation, Publishing rights manager, or pharmacy needs at this time.    Krista Holder is an 39 y.o. year old female who is a primary patient of Patient, No Pcp Per.  [name] was given information about Medicaid Managed Care team care coordination services today. Krista Holder agreed to discuss available services and verbal consent obtained.   Patient declined services.   Review of patient status, laboratory and other test data was performed as part of evaluation for provision of services.  SDOH: SDOH Screenings   Alcohol Screen:   . Last Alcohol Screening Score (AUDIT):   Depression (PHQ2-9):   . PHQ-2 Score:   Financial Resource Strain:   . Difficulty of Paying Living Expenses:   Food Insecurity:   . Worried About Programme researcher, broadcasting/film/video in the Last Year:   . [employer] of Food in the Last Year:   Housing:   . Last Housing Risk Score:   Physical Activity:   . Days of Exercise per Week:   . Minutes of Exercise per Session:   Social Connections:   . Frequency of Communication with Friends and Family:   . Frequency of Social Gatherings with Friends and Family:   . Attends Religious Services:   . Active Member of Clubs or Organizations:   . Attends Banker Meetings:   Marland Kitchen Marital Status:   Stress:   . Feeling of Stress :   Tobacco Use: Low Risk   . Smoking Tobacco Use: Never Smoker  . Smokeless  Tobacco Use: Never Used  Transportation Needs:   . Freight forwarder (Medical):   Marland Kitchen Lack of Transportation (Non-Medical):      Objective:  Per KPN (Knowledge Performance Now, point of care tool) and chart review, patient has had no recent hospitalizations or ED visits within the last 6 months.   Patient had a urgent care visit on 04/03/2020 for Acute vaginitis.  Patient has a history of Bacterial vaginitis and Vaginal yeast infection.    No Known Allergies  Medications:    Medications Reviewed Today    Reviewed by Liberty Handy, RN (Registered Nurse) on 04/03/20 at 1800  Med List Status: <None>  Medication Order Taking? Sig Documenting Provider Last Dose Status Informant  diphenhydrAMINE (BENADRYL) 25 MG tablet [id number] No Take 1 tablet (25 mg total) by mouth every 6 (six) hours. Emi Holes, PA-C Unknown Unknown time Active   Fexofenadine HCl (ALLERGY 24-HR PO) [id number]  Take 1 tablet by mouth as needed (allergies). [provider]  Active Self  fluticasone (FLONASE) 50 MCG/ACT nasal spray [id number] No Place 1 spray into both nostrils daily. Emi Holes, PA-C Unknown Unknown time Active   hydrocortisone cream 1 % [id number]  Apply to affected area 2 times daily Law, Alexandra M, PA-C  Active   ibuprofen (ADVIL,MOTRIN) 200 MG tablet [id number]  Take 200 mg by mouth every 6 (six) hours as needed for cramping. [provider]  Active Self  ibuprofen (ADVIL,MOTRIN) 800 MG tablet [id number] No Take 1 tablet (800 mg total) by mouth 3 (three) times daily. Joy, Shawn C, PA-C Unknown Unknown time Active Self  metroNIDAZOLE (FLAGYL) 500 MG tablet [id number]  Take 1 tablet (500 mg total) by mouth 2 (two) times daily. Coralyn Mark, NP  Consider Medication Status and Discontinue (Completed Course)   Miconazole Nitrate (MONISTAT-DERM EX) [id number] Yes Apply topically. [provider] 04/03/2020 Unknown time Active   Phenazopyridine HCl (AZO TABS PO) [id number] Yes  Take by mouth. [provider]  Active   predniSONE (STERAPRED UNI-PAK 21 TAB) 10 MG (21) TBPK tablet [id number]  Take 6 tabs day 1, 5 tabs day 2, 4 tabs day 3, 3 tabs day 4, 2 tabs day 5, and 1 tab on day 6.  Patient not taking: Reported on 05/23/2018   Anselm Pancoast, PA-C  Consider Medication Status and Discontinue (Completed Course) Self          Assessment:  Received Patient Engagement Center referral on 04/11/2020.   Referral source: Cyd Silence.   Referral reason: Care Coordination (Managed Medicaid), Managed Medicaid Services needed:    Nurse Case Manager,  EMMI red; pt has questions related to discharge.    Screening follow up not completed due to patient refusing services and will proceed with case closure.     Goals Addressed   None     Plan: RNCM will close case due to patient declining [facility] Care Management services.     Krista Holder H. Gardiner Barefoot, BSN, CCM [facility] Care Management [facility] Telephonic CM Phone: [phone] Fax: [phone]

## 2020-06-28 DIAGNOSIS — Z419 Encounter for procedure for purposes other than remedying health state, unspecified: Secondary | ICD-10-CM | POA: Diagnosis not present

## 2020-07-29 DIAGNOSIS — Z419 Encounter for procedure for purposes other than remedying health state, unspecified: Secondary | ICD-10-CM | POA: Diagnosis not present

## 2020-08-28 DIAGNOSIS — Z419 Encounter for procedure for purposes other than remedying health state, unspecified: Secondary | ICD-10-CM | POA: Diagnosis not present

## 2020-09-28 DIAGNOSIS — Z419 Encounter for procedure for purposes other than remedying health state, unspecified: Secondary | ICD-10-CM | POA: Diagnosis not present

## 2020-10-29 DIAGNOSIS — Z419 Encounter for procedure for purposes other than remedying health state, unspecified: Secondary | ICD-10-CM | POA: Diagnosis not present

## 2020-11-26 DIAGNOSIS — Z419 Encounter for procedure for purposes other than remedying health state, unspecified: Secondary | ICD-10-CM | POA: Diagnosis not present

## 2020-12-27 DIAGNOSIS — Z419 Encounter for procedure for purposes other than remedying health state, unspecified: Secondary | ICD-10-CM | POA: Diagnosis not present

## 2021-01-26 DIAGNOSIS — Z419 Encounter for procedure for purposes other than remedying health state, unspecified: Secondary | ICD-10-CM | POA: Diagnosis not present

## 2021-02-26 DIAGNOSIS — Z419 Encounter for procedure for purposes other than remedying health state, unspecified: Secondary | ICD-10-CM | POA: Diagnosis not present

## 2021-03-28 DIAGNOSIS — Z419 Encounter for procedure for purposes other than remedying health state, unspecified: Secondary | ICD-10-CM | POA: Diagnosis not present

## 2021-04-28 DIAGNOSIS — Z419 Encounter for procedure for purposes other than remedying health state, unspecified: Secondary | ICD-10-CM | POA: Diagnosis not present

## 2021-05-29 DIAGNOSIS — Z419 Encounter for procedure for purposes other than remedying health state, unspecified: Secondary | ICD-10-CM | POA: Diagnosis not present

## 2021-06-28 DIAGNOSIS — Z419 Encounter for procedure for purposes other than remedying health state, unspecified: Secondary | ICD-10-CM | POA: Diagnosis not present

## 2021-07-29 DIAGNOSIS — Z419 Encounter for procedure for purposes other than remedying health state, unspecified: Secondary | ICD-10-CM | POA: Diagnosis not present

## 2021-08-28 DIAGNOSIS — Z419 Encounter for procedure for purposes other than remedying health state, unspecified: Secondary | ICD-10-CM | POA: Diagnosis not present

## 2021-09-28 DIAGNOSIS — Z419 Encounter for procedure for purposes other than remedying health state, unspecified: Secondary | ICD-10-CM | POA: Diagnosis not present

## 2021-10-29 DIAGNOSIS — Z419 Encounter for procedure for purposes other than remedying health state, unspecified: Secondary | ICD-10-CM | POA: Diagnosis not present

## 2021-11-26 DIAGNOSIS — Z419 Encounter for procedure for purposes other than remedying health state, unspecified: Secondary | ICD-10-CM | POA: Diagnosis not present

## 2021-12-27 DIAGNOSIS — Z419 Encounter for procedure for purposes other than remedying health state, unspecified: Secondary | ICD-10-CM | POA: Diagnosis not present

## 2022-01-05 ENCOUNTER — Ambulatory Visit (HOSPITAL_COMMUNITY)
Admission: EM | Admit: 2022-01-05 | Discharge: 2022-01-05 | Disposition: A | Discharge status: Home / Self Care | Payer: Medicaid Other | Attending: Family Medicine | Admitting: Family Medicine

## 2022-01-05 ENCOUNTER — Encounter (HOSPITAL_COMMUNITY): Payer: Self-pay

## 2022-01-05 DIAGNOSIS — N760 Acute vaginitis: Secondary | ICD-10-CM | POA: Diagnosis not present

## 2022-01-05 DIAGNOSIS — R300 Dysuria: Secondary | ICD-10-CM | POA: Insufficient documentation

## 2022-01-05 LAB — POCT URINALYSIS DIPSTICK, ED / UC
Bilirubin Urine: NEGATIVE
Glucose, UA: NEGATIVE mg/dL
Ketones, ur: NEGATIVE mg/dL
Nitrite: NEGATIVE
Protein, ur: NEGATIVE mg/dL
Specific Gravity, Urine: <=1.005 (ref 1.005–1.030)
Urobilinogen, UA: 0.2 mg/dL (ref 0.0–1.0)
pH: 6 (ref 5.0–8.0)

## 2022-01-05 MED ORDER — FLUCONAZOLE 150 MG PO TABS: 150.0000 mg | ORAL_TABLET | ORAL | 0 refills | Status: AC | PRN

## 2022-01-05 MED ORDER — NYSTATIN 100000 UNIT/GM EX CREA: TOPICAL_CREAM | Freq: Two times a day (BID) | CUTANEOUS | 0 refills | Status: AC | PRN

## 2022-01-05 NOTE — Discharge Instructions (Addendum)
Treating you for vaginitis start Diflucan today I also prescribed a nystatin cream to apply to your vaginal area to help with discomfort and irritation. ?I am culturing your urine as it is does not show a definitive UTI today. ?Your vaginal cytology will result within 24 to 48 hours.  Our office will call you any additional treatment is warranted or if your results are abnormal ?

## 2022-01-05 NOTE — ED Provider Notes (Signed)
?[facility] ? ? ? ?CSN: [id number] ?Arrival date & time: 01/05/22  1405 ? ? ?  ? ?History   ?Chief Complaint ?Chief Complaint  ?Patient presents with  ? Vaginitis  ? ? ?HPI ?Krista Holder is a 41 y.o. female.  ? ?HPI ?Patient presents today vaginitis x2 days.  She is also endorses having urinary frequency.  Reports she has had an acute urinary tract infections more frequently since turning 40.  No new sex partners.  Denies any flank pain.  She does endorse some lower pelvic pressure.  No fever, nausea, vomiting.  ? ? ?History reviewed. No pertinent past medical history. ? ?There are no problems to display for this patient. ? ? ?History reviewed. No pertinent surgical history. ? ?OB History   ?No obstetric history on file. ?  ? ? ? ?Home Medications   ? ?Prior to Admission medications   ?Medication Sig Start Date End Date Taking? Authorizing Provider  ?fluconazole (DIFLUCAN) 150 MG tablet Take 1 tablet (150 mg total) by mouth every three (3) days as needed. 01/05/22  Yes Bing Neighbors, FNP  ?nystatin cream (MYCOSTATIN) Apply topically 2 (two) times daily as needed for dry skin (vaginitis). Apply to affected area 2 times daily 01/05/22  Yes Bing Neighbors, FNP  ?clotrimazole (GYNE-LOTRIMIN) 1 % vaginal cream May apply topically once daily as needed for itching 04/03/20   Linus Mako B, NP  ?diphenhydrAMINE (BENADRYL) 25 MG tablet Take 1 tablet (25 mg total) by mouth every 6 (six) hours. 05/23/18   Emi Holes, PA-C  ?Fexofenadine HCl (ALLERGY 24-HR PO) Take 1 tablet by mouth as needed (allergies).    [provider]  ?fluticasone (FLONASE) 50 MCG/ACT nasal spray Place 1 spray into both nostrils daily. 05/23/18   Emi Holes, PA-C  ?hydrocortisone cream 1 % Apply to affected area 2 times daily 05/23/18   Emi Holes, PA-C  ?ibuprofen (ADVIL,MOTRIN) 200 MG tablet Take 200 mg by mouth every 6 (six) hours as needed for cramping.    [provider]  ?ibuprofen (ADVIL,MOTRIN)  800 MG tablet Take 1 tablet (800 mg total) by mouth 3 (three) times daily. 10/12/16   Joy, Shawn C, PA-C  ?metroNIDAZOLE (FLAGYL) 500 MG tablet Take 1 tablet (500 mg total) by mouth 2 (two) times daily. 08/17/19   Coralyn Mark, NP  ?metroNIDAZOLE (FLAGYL) 500 MG tablet Take 1 tablet (500 mg total) by mouth 2 (two) times daily. 04/04/20   LampteyBritta Mccreedy, MD  ?Miconazole Nitrate (MONISTAT-DERM EX) Apply topically.    [provider]  ?Phenazopyridine HCl (AZO TABS PO) Take by mouth.    [provider]  ?predniSONE (STERAPRED UNI-PAK 21 TAB) 10 MG (21) TBPK tablet Take 6 tabs day 1, 5 tabs day 2, 4 tabs day 3, 3 tabs day 4, 2 tabs day 5, and 1 tab on day 6. ?Patient not taking: Reported on 05/23/2018 10/12/16   Anselm Pancoast, PA-C  ? ? ?Family History ?Family History  ?Problem Relation Age of Onset  ? Hypertension Mother   ? ? ?Social History ?Social History  ? ?Tobacco Use  ? Smoking status: Never  ? Smokeless tobacco: Never  ?Substance Use Topics  ? Alcohol use: Yes  ?  Comment: weekends  ? Drug use: No  ? ? ? ?Allergies   ?Patient has no known allergies. ? ? ?Review of Systems ?Review of Systems ?Pertinent negatives listed in HPI  ? ?Physical Exam ?Triage Vital Signs ?ED  Triage Vitals  ?Enc Vitals Group  ?   BP 01/05/22 1554 102/80  ?   Pulse Rate 01/05/22 1554 86  ?   Resp 01/05/22 1554 18  ?   Temp 01/05/22 1554 98.1 ?F (36.7 ?C)  ?   Temp Source 01/05/22 1554 Oral  ?   SpO2 01/05/22 1554 100 %  ?   Weight --   ?   Height --   ?   Head Circumference --   ?   Peak Flow --   ?   Pain Score 01/05/22 1552 3  ?   Pain Loc --   ?   Pain Edu? --   ?   Excl. in GC? --   ? ?No data found. ? ?Updated Vital Signs ?BP 102/80 (BP Location: Right Arm)   Pulse 86   Temp 98.1 ?F (36.7 ?C) (Oral)   Resp 18   LMP 12/22/2021   SpO2 100%  ? ?Visual Acuity ?Right Eye Distance:   ?Left Eye Distance:   ?Bilateral Distance:   ? ?Right Eye Near:   ?Left Eye Near:    ?Bilateral Near:    ? ?Physical Exam ?General  appearance: alert, well developed, well nourished, cooperative and in no distress ?Head: Normocephalic, without obvious abnormality, atraumatic ?Respiratory: Respirations even and unlabored, normal respiratory rate ?Heart: rate and rhythm normal. No gallop or murmurs noted on exam  ?Abdomen: Negative for CVA tenderness  ?Extremities: No gross deformities ?Skin: Skin color, texture, turgor normal. No rashes seen  ?Psych: Appropriate mood and affect. ?Neurologic: Mental status: Alert, oriented to person, place, and time, thought content appropriate.  ? ?Vaginal self swab collected  ? ?UC Treatments / Results  ?Labs ?(all labs ordered are listed, but only abnormal results are displayed) ?Labs Reviewed  ?POCT URINALYSIS DIPSTICK, ED / UC - Abnormal; Notable for the following components:  ?    Result Value  ? Hgb urine dipstick TRACE (*)   ? Leukocytes,Ua SMALL (*)   ? All other components within normal limits  ?URINE CULTURE  ?CERVICOVAGINAL ANCILLARY ONLY  ? ? ?EKG ? ? ?Radiology ?No results found. ? ?Procedures ?Procedures (including critical care time) ? ?Medications Ordered in UC ?Medications - No data to display ? ?Initial Impression / Assessment and Plan / UC Course  ?I have reviewed the triage vital signs and the nursing notes. ? ?Pertinent labs & imaging results that were available during my care of the patient were reviewed by me and considered in my medical decision making (see chart for details). ? ?  ?Vaginitis ?Vaginal cytology pending. ?Patient is also experiencing some dysuria symptoms.  UA today is inconclusive for that of a urinary tract infection.  We will culture urine to rule out a bacterial urine infection as a source of symptoms. Return if symptoms worsen or do not readily improve. ? ?Final Clinical Impressions(s) / UC Diagnoses  ? ?Final diagnoses:  ?Vaginitis and vulvovaginitis  ?Dysuria  ? ? ? ?Discharge Instructions   ? ?  ?Treating you for vaginitis start Diflucan today I also prescribed a  nystatin cream to apply to your vaginal area to help with discomfort and irritation. ?I am culturing your urine as it is does not show a definitive UTI today. ?Your vaginal cytology will result within 24 to 48 hours.  Our office will call you any additional treatment is warranted or if your results are abnormal ? ? ? ? ?ED Prescriptions   ? ? Medication Sig Dispense Auth. Provider  ?  nystatin cream (MYCOSTATIN) Apply topically 2 (two) times daily as needed for dry skin (vaginitis). Apply to affected area 2 times daily 90 g Bing Neighbors, FNP  ? fluconazole (DIFLUCAN) 150 MG tablet Take 1 tablet (150 mg total) by mouth every three (3) days as needed. 3 tablet Bing Neighbors, FNP  ? ?  ? ?PDMP not reviewed this encounter. ?  ?Bing Neighbors, FNP ?01/05/22 1700 ? ?

## 2022-01-05 NOTE — ED Triage Notes (Signed)
Pt presents with vaginal irritation and itching X 2 days. ?

## 2022-01-06 LAB — URINE CULTURE
Culture: NO GROWTH
Special Requests: NORMAL

## 2022-01-06 LAB — CERVICOVAGINAL ANCILLARY ONLY
Bacterial Vaginitis (gardnerella): NEGATIVE
Candida Glabrata: NEGATIVE
Candida Vaginitis: POSITIVE — AB
Chlamydia: NEGATIVE
Comment: NEGATIVE
Comment: NEGATIVE
Comment: NEGATIVE
Comment: NEGATIVE
Comment: NEGATIVE
Comment: NORMAL
Neisseria Gonorrhea: NEGATIVE
Trichomonas: NEGATIVE

## 2022-01-26 DIAGNOSIS — Z419 Encounter for procedure for purposes other than remedying health state, unspecified: Secondary | ICD-10-CM | POA: Diagnosis not present

## 2022-02-26 DIAGNOSIS — Z419 Encounter for procedure for purposes other than remedying health state, unspecified: Secondary | ICD-10-CM | POA: Diagnosis not present

## 2022-03-28 DIAGNOSIS — Z419 Encounter for procedure for purposes other than remedying health state, unspecified: Secondary | ICD-10-CM | POA: Diagnosis not present

## 2022-04-28 DIAGNOSIS — Z419 Encounter for procedure for purposes other than remedying health state, unspecified: Secondary | ICD-10-CM | POA: Diagnosis not present

## 2022-05-29 DIAGNOSIS — Z419 Encounter for procedure for purposes other than remedying health state, unspecified: Secondary | ICD-10-CM | POA: Diagnosis not present

## 2022-06-28 DIAGNOSIS — Z419 Encounter for procedure for purposes other than remedying health state, unspecified: Secondary | ICD-10-CM | POA: Diagnosis not present

## 2022-07-29 DIAGNOSIS — Z419 Encounter for procedure for purposes other than remedying health state, unspecified: Secondary | ICD-10-CM | POA: Diagnosis not present

## 2022-08-28 DIAGNOSIS — Z419 Encounter for procedure for purposes other than remedying health state, unspecified: Secondary | ICD-10-CM | POA: Diagnosis not present

## 2022-09-28 DIAGNOSIS — Z419 Encounter for procedure for purposes other than remedying health state, unspecified: Secondary | ICD-10-CM | POA: Diagnosis not present

## 2022-10-29 DIAGNOSIS — Z419 Encounter for procedure for purposes other than remedying health state, unspecified: Secondary | ICD-10-CM | POA: Diagnosis not present

## 2022-11-27 DIAGNOSIS — Z419 Encounter for procedure for purposes other than remedying health state, unspecified: Secondary | ICD-10-CM | POA: Diagnosis not present

## 2022-12-28 DIAGNOSIS — Z419 Encounter for procedure for purposes other than remedying health state, unspecified: Secondary | ICD-10-CM | POA: Diagnosis not present

## 2023-01-27 DIAGNOSIS — Z419 Encounter for procedure for purposes other than remedying health state, unspecified: Secondary | ICD-10-CM | POA: Diagnosis not present

## 2023-02-27 DIAGNOSIS — Z419 Encounter for procedure for purposes other than remedying health state, unspecified: Secondary | ICD-10-CM | POA: Diagnosis not present

## 2023-03-29 DIAGNOSIS — Z419 Encounter for procedure for purposes other than remedying health state, unspecified: Secondary | ICD-10-CM | POA: Diagnosis not present

## 2023-04-29 DIAGNOSIS — Z419 Encounter for procedure for purposes other than remedying health state, unspecified: Secondary | ICD-10-CM | POA: Diagnosis not present

## 2023-05-30 DIAGNOSIS — Z419 Encounter for procedure for purposes other than remedying health state, unspecified: Secondary | ICD-10-CM | POA: Diagnosis not present

## 2023-06-29 DIAGNOSIS — Z419 Encounter for procedure for purposes other than remedying health state, unspecified: Secondary | ICD-10-CM | POA: Diagnosis not present

## 2023-07-30 DIAGNOSIS — Z419 Encounter for procedure for purposes other than remedying health state, unspecified: Secondary | ICD-10-CM | POA: Diagnosis not present

## 2023-08-29 DIAGNOSIS — Z419 Encounter for procedure for purposes other than remedying health state, unspecified: Secondary | ICD-10-CM | POA: Diagnosis not present

## 2023-09-29 DIAGNOSIS — Z419 Encounter for procedure for purposes other than remedying health state, unspecified: Secondary | ICD-10-CM | POA: Diagnosis not present

## 2023-10-30 DIAGNOSIS — Z419 Encounter for procedure for purposes other than remedying health state, unspecified: Secondary | ICD-10-CM | POA: Diagnosis not present

## 2023-11-08 DIAGNOSIS — J101 Influenza due to other identified influenza virus with other respiratory manifestations: Secondary | ICD-10-CM | POA: Diagnosis not present

## 2023-11-08 DIAGNOSIS — Z20822 Contact with and (suspected) exposure to covid-19: Secondary | ICD-10-CM | POA: Diagnosis not present

## 2023-11-27 DIAGNOSIS — Z419 Encounter for procedure for purposes other than remedying health state, unspecified: Secondary | ICD-10-CM | POA: Diagnosis not present

## 2024-01-08 DIAGNOSIS — Z419 Encounter for procedure for purposes other than remedying health state, unspecified: Secondary | ICD-10-CM | POA: Diagnosis not present

## 2024-02-07 DIAGNOSIS — Z419 Encounter for procedure for purposes other than remedying health state, unspecified: Secondary | ICD-10-CM | POA: Diagnosis not present

## 2024-03-09 DIAGNOSIS — Z419 Encounter for procedure for purposes other than remedying health state, unspecified: Secondary | ICD-10-CM | POA: Diagnosis not present

## 2024-04-08 DIAGNOSIS — Z419 Encounter for procedure for purposes other than remedying health state, unspecified: Secondary | ICD-10-CM | POA: Diagnosis not present

## 2024-05-09 DIAGNOSIS — Z419 Encounter for procedure for purposes other than remedying health state, unspecified: Secondary | ICD-10-CM | POA: Diagnosis not present

## 2024-06-09 DIAGNOSIS — Z419 Encounter for procedure for purposes other than remedying health state, unspecified: Secondary | ICD-10-CM | POA: Diagnosis not present

## 2024-06-29 ENCOUNTER — Ambulatory Visit: Payer: Self-pay | Admitting: Family Medicine

## 2024-08-23 DIAGNOSIS — J069 Acute upper respiratory infection, unspecified: Secondary | ICD-10-CM | POA: Diagnosis not present

## 2024-09-08 DIAGNOSIS — Z419 Encounter for procedure for purposes other than remedying health state, unspecified: Secondary | ICD-10-CM | POA: Diagnosis not present

## 2024-10-04 ENCOUNTER — Ambulatory Visit: Payer: Self-pay | Admitting: Family Medicine

## 2024-10-30 ENCOUNTER — Telehealth: Payer: Self-pay

## 2024-10-30 NOTE — Telephone Encounter (Signed)
 Attempted to call patient to see if she can do New Patient visit today via video- no answer unable to leave VM/

## 2024-10-30 NOTE — Telephone Encounter (Signed)
Attempt to call patient again-no answer.  

## 2024-10-31 ENCOUNTER — Ambulatory Visit: Payer: Self-pay | Admitting: Nurse Practitioner

## 2024-10-31 ENCOUNTER — Encounter: Payer: Self-pay | Admitting: Family Medicine
